# Patient Record
Sex: Female | Born: 1965 | Race: Black or African American | Hispanic: No | Smoking: Never smoker
Health system: Southern US, Community
[De-identification: ages and names within clinical notes are randomized; demographics above are authoritative.]

---

## 1995-07-14 HISTORY — PX: NASAL SINUS SURGERY: SHX719

## 2001-07-13 HISTORY — PX: ESOPHAGOGASTRODUODENOSCOPY: SHX1529

## 2009-12-11 LAB — HM MAMMOGRAPHY: HM MAMMO: NORMAL

## 2010-10-31 LAB — HM PAP SMEAR: HM Pap smear: NORMAL

## 2013-07-13 HISTORY — PX: DILATION AND CURETTAGE OF UTERUS: SHX78

## 2014-03-13 HISTORY — PX: OVARIAN CYST REMOVAL: SHX89

## 2015-02-20 ENCOUNTER — Encounter: Payer: Self-pay | Admitting: Internal Medicine

## 2015-02-20 DIAGNOSIS — K219 Gastro-esophageal reflux disease without esophagitis: Secondary | ICD-10-CM | POA: Insufficient documentation

## 2015-02-20 DIAGNOSIS — M1710 Unilateral primary osteoarthritis, unspecified knee: Secondary | ICD-10-CM | POA: Insufficient documentation

## 2015-02-20 DIAGNOSIS — B009 Herpesviral infection, unspecified: Secondary | ICD-10-CM | POA: Insufficient documentation

## 2015-02-20 DIAGNOSIS — B351 Tinea unguium: Secondary | ICD-10-CM | POA: Insufficient documentation

## 2015-02-20 DIAGNOSIS — M179 Osteoarthritis of knee, unspecified: Secondary | ICD-10-CM | POA: Insufficient documentation

## 2015-02-20 DIAGNOSIS — N200 Calculus of kidney: Secondary | ICD-10-CM | POA: Insufficient documentation

## 2015-02-20 DIAGNOSIS — N946 Dysmenorrhea, unspecified: Secondary | ICD-10-CM | POA: Insufficient documentation

## 2015-02-22 ENCOUNTER — Ambulatory Visit: Payer: Self-pay | Admitting: Family Medicine

## 2015-06-20 ENCOUNTER — Other Ambulatory Visit: Payer: Self-pay

## 2015-07-03 ENCOUNTER — Other Ambulatory Visit: Payer: Self-pay

## 2016-09-30 ENCOUNTER — Encounter: Payer: Self-pay | Admitting: Internal Medicine

## 2016-09-30 ENCOUNTER — Ambulatory Visit (INDEPENDENT_AMBULATORY_CARE_PROVIDER_SITE_OTHER): Payer: BLUE CROSS/BLUE SHIELD | Admitting: Internal Medicine

## 2016-09-30 VITALS — BP 124/70 | HR 72 | Ht 64.0 in | Wt 193.0 lb

## 2016-09-30 DIAGNOSIS — K635 Polyp of colon: Secondary | ICD-10-CM | POA: Diagnosis not present

## 2016-09-30 DIAGNOSIS — M62838 Other muscle spasm: Secondary | ICD-10-CM

## 2016-09-30 DIAGNOSIS — N946 Dysmenorrhea, unspecified: Secondary | ICD-10-CM

## 2016-09-30 DIAGNOSIS — B351 Tinea unguium: Secondary | ICD-10-CM | POA: Diagnosis not present

## 2016-09-30 MED ORDER — CYCLOBENZAPRINE HCL 10 MG PO TABS: 10.0000 mg | ORAL_TABLET | Freq: Three times a day (TID) | ORAL | 1 refills | Status: AC | PRN

## 2016-09-30 NOTE — Progress Notes (Signed)
Date:  09/30/2016   Name:  Margaret HousemanLisa Ducat   DOB:  08/26/1965   MRN:  [id number]  Previous patient last seen in October 2015.  She sees GYN for routine exams.  Is up to date on Pap, Mammogram and colonoscopy.  Chief Complaint: Establish Care and Back Pain (Radiates from neck down to mid-back. Pain is also in right shoulder. Tried Tylenol Arthritis and does nothing for it. Worse at night. Can't sleep at night due to back pain. )  Back Pain  This is a new problem. The current episode started more than 1 month ago. The problem occurs constantly. The problem has been gradually worsening since onset. The pain is present in the thoracic spine. The quality of the pain is described as aching. Radiates to: right arm. The pain is worse during the night. The symptoms are aggravated by lying down. Pertinent negatives include no abdominal pain, chest pain, fever, headaches, numbness, paresthesias, perianal numbness or weakness. She has tried NSAIDs for the symptoms.  No known injury.  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  She thinks that her work space is ergonomic.  She has the most pain trying to sleep - wants to sleep on her side but can't. She as taken advil but when she takes high doses she has heavy menstrual bleeding. She has not tried massage, heat or ice.   Review of Systems  Constitutional: Negative for chills, diaphoresis and fever.  Eyes: Negative for visual disturbance.  Respiratory: Negative for chest tightness, shortness of breath and wheezing.   Cardiovascular: Negative for chest pain, palpitations and leg swelling.  Gastrointestinal: Negative for abdominal pain.  Musculoskeletal: Positive for back pain and myalgias. Negative for gait problem.  Skin: Negative for color change and rash.  Neurological: Negative for weakness, numbness, headaches and paresthesias.  Hematological: Negative for adenopathy.  Psychiatric/Behavioral: Positive for sleep disturbance.    Patient Active  Problem List   Diagnosis Date Noted  . Acid reflux 02/20/2015  . Dysmenorrhea 02/20/2015  . Arthritis of knee, degenerative 02/20/2015  . Calcium kidney stones 02/20/2015  . Fungal infection of toenail 02/20/2015  . 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 Authorizing Provider  Black Cohosh 20 MG TABS Take 1 tablet by mouth daily.   Yes Historical Provider, MD  cholecalciferol (VITAMIN D) 1000 units tablet Take 1,000 Units by mouth daily.   Yes Historical Provider, MD  ferrous sulfate 325 (65 FE) MG EC tablet Take 325 mg by mouth 3 (three) times daily with meals.   Yes Historical Provider, MD  ibuprofen (IBU-200) 200 MG tablet Take 200 mg by mouth every 6 (six) hours as needed.   Yes Historical Provider, MD    Allergies  Allergen Reactions  . Diphenhydramine Hcl Swelling  . Requip  [Ropinirole]     Other reaction(s): Headache  . Sulfa Antibiotics Rash    Past Surgical History:  Procedure Laterality Date  . DILATION AND CURETTAGE OF UTERUS  2015   uterine polyp  . ESOPHAGOGASTRODUODENOSCOPY  2003   HH and GERD  . NASAL SINUS SURGERY  1997  . OVARIAN CYST REMOVAL  03/2014    Social History  Substance Use Topics  . Smoking status: Never Smoker  . Smokeless tobacco: Never Used  . Alcohol use No     Medication list has been reviewed and updated.   Physical Exam  Constitutional: She is oriented to  person, place, and time. She appears well-developed. No distress.  HENT:  Head: Normocephalic and atraumatic.  Cardiovascular: Normal rate, regular rhythm and normal heart sounds.   Pulmonary/Chest: Effort normal and breath sounds normal. No respiratory distress. She has no wheezes.  Musculoskeletal:       Cervical back: She exhibits decreased range of motion, tenderness and spasm.       Thoracic back: She exhibits spasm. She exhibits no bony tenderness.  Neurological: She is alert and oriented to  person, place, and time. She has normal strength and normal reflexes. No sensory deficit.  Skin: Skin is warm and dry. No rash noted.  Fungal nail thickening great, second and fourth toes on right foot.  Great toenail is loosening.  Psychiatric: She has a normal mood and affect. Her behavior is normal. Thought content normal.  Nursing note and vitals reviewed.   BP 124/70 (BP Location: Right Arm, Patient Position: Sitting, Cuff Size: Normal)   Pulse 72   Ht 5\' 4"  (1.626 m)   Wt 193 lb (87.5 kg)   LMP 07/01/2016 (Within Weeks)   SpO2 98%   BMI 33.13 kg/m   Assessment and Plan: 1. Neck muscle spasm Tylenol 1000 mg tid Heat 20 minutes three times a day Flexeril as instructed Positioning and neck support advise provided  2. Fungal infection of toenail Do not recommend aggressive treatment since only minimally sx  3. Dysmenorrhea Follow up with GYN if needed Perimenopausal by history   Meds ordered this encounter  Medications  . cyclobenzaprine (FLEXERIL) 10 MG tablet    Sig: Take 1 tablet (10 mg total) by mouth 3 (three) times daily as needed for muscle spasms.    Dispense:  30 tablet    Refill:  1    Bari Edward, MD [facility]  09/30/2016

## 2016-09-30 NOTE — Patient Instructions (Signed)
Cyclobenzaprine - take 1/2 tablet when you get home from work and apply heat for 20 minutes.  Take 1 tablet at bedtime.  Tylenol 1000 mg three times a day

## 2016-10-19 ENCOUNTER — Ambulatory Visit (INDEPENDENT_AMBULATORY_CARE_PROVIDER_SITE_OTHER): Payer: BLUE CROSS/BLUE SHIELD | Admitting: Internal Medicine

## 2016-10-19 ENCOUNTER — Encounter: Payer: Self-pay | Admitting: Internal Medicine

## 2016-10-19 VITALS — BP 124/88 | HR 86 | Temp 98.0°F | Ht 64.0 in | Wt 192.0 lb

## 2016-10-19 DIAGNOSIS — Z87442 Personal history of urinary calculi: Secondary | ICD-10-CM | POA: Diagnosis not present

## 2016-10-19 DIAGNOSIS — R102 Pelvic and perineal pain: Secondary | ICD-10-CM | POA: Diagnosis not present

## 2016-10-19 LAB — POC URINALYSIS WITH MICROSCOPIC (NON AUTO)MANUAL RESULT
Bilirubin, UA: NEGATIVE
Crystals: 0
Epithelial cells, urine per micros: 0
Glucose, UA: NEGATIVE
Ketones, UA: NEGATIVE
Mucus, UA: 0
NITRITE UA: NEGATIVE
PH UA: 5 (ref 5.0–8.0)
Protein, UA: NEGATIVE
RBC: 3 M/uL — AB (ref 4.04–5.48)
SPEC GRAV UA: 1.005 (ref 1.030–1.035)
Urobilinogen, UA: 0.2 (ref ?–2.0)
WBC CASTS UA: 10

## 2016-10-19 MED ORDER — CIPROFLOXACIN HCL 250 MG PO TABS: 250.0000 mg | ORAL_TABLET | Freq: Two times a day (BID) | ORAL | 0 refills | Status: DC

## 2016-10-19 NOTE — Progress Notes (Signed)
Date:  10/19/2016   Name:  Margaret Medina   DOB:  11-Sep-1965   MRN:  [id number]   Chief Complaint: Abdominal Pain (lower abdominal pain- spasming and sharp. Has previously had symptoms before and this feels the same. 2 days ago passed out in bathroom twice due to pain. Was having sweating, fever, cold chills, and headaches. Felt like had to throw up but did not. There is also pressure on rectum. "Feels like have to use bathroom but do not have to go.") Abdominal Pain  This is a new problem. The current episode started in the past 7 days. The onset quality is sudden. The problem occurs intermittently. The problem has been rapidly improving. The pain is located in the suprapubic region and left flank. The pain is mild. The quality of the pain is colicky and cramping. The abdominal pain radiates to the suprapubic region. Associated symptoms include dysuria. Pertinent negatives include no arthralgias, fever, frequency or hematuria. The pain is relieved by nothing.  Today she is much better - still has some lower abd/suprapubic discomfort and mild dysuria.  No obvious blood in the urine.  No further rectal pressure, diaphoresis or lightheadedness. She had a normal stool 36 hours ago.  No vomiting or nausea.   Review of Systems  Constitutional: Negative for chills, diaphoresis and fever.  Respiratory: Negative for chest tightness and shortness of breath.   Cardiovascular: Negative for chest pain and palpitations.  Gastrointestinal: Positive for abdominal pain. Negative for blood in stool.  Genitourinary: Positive for dysuria. Negative for frequency and hematuria.  Musculoskeletal: Negative for arthralgias and back pain.    Patient Active Problem List   Diagnosis Date Noted  . Colon polyp, hyperplastic 09/30/2016  . Acid reflux 02/20/2015  . Dysmenorrhea 02/20/2015  . Arthritis of knee, degenerative 02/20/2015  . Calcium kidney stones 02/20/2015  . Fungal infection of toenail 02/20/2015  .  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 Authorizing Provider  Black Cohosh 20 MG TABS Take 1 tablet by mouth daily.   Yes Historical Provider, MD  cholecalciferol (VITAMIN D) 1000 units tablet Take 1,000 Units by mouth daily.   Yes Historical Provider, MD  cyclobenzaprine (FLEXERIL) 10 MG tablet Take 1 tablet (10 mg total) by mouth 3 (three) times daily as needed for muscle spasms. 09/30/16  Yes Reubin Milan, MD  ferrous sulfate 325 (65 FE) MG EC tablet Take 325 mg by mouth 3 (three) times daily with meals.   Yes Historical Provider, MD  ibuprofen (IBU-200) 200 MG tablet Take 200 mg by mouth every 6 (six) hours as needed.   Yes Historical Provider, MD    Allergies  Allergen Reactions  . Diphenhydramine Hcl Swelling  . Requip  [Ropinirole]     Other reaction(s): Headache  . Sulfa Antibiotics Rash    Past Surgical History:  Procedure Laterality Date  . DILATION AND CURETTAGE OF UTERUS  2015   uterine polyp  . ESOPHAGOGASTRODUODENOSCOPY  2003   HH and GERD  . NASAL SINUS SURGERY  1997  . OVARIAN CYST REMOVAL  03/2014    Social History  Substance Use Topics  . Smoking status: Never Smoker  . Smokeless tobacco: Never Used  . Alcohol use No     Medication list has been reviewed and updated.   Physical Exam  Constitutional: She is oriented to person, place, and time. She appears well-developed. No distress.  HENT:  Head:  Normocephalic and atraumatic.  Neck: Normal range of motion.  Cardiovascular: Normal rate, regular rhythm and normal heart sounds.   Pulmonary/Chest: Effort normal and breath sounds normal. No respiratory distress.  Abdominal: Soft. Normal appearance and bowel sounds are normal. There is no hepatosplenomegaly. There is tenderness in the suprapubic area. There is CVA tenderness (mild left sided). There is no rigidity and no guarding.  Musculoskeletal: Normal range of motion.    Neurological: She is alert and oriented to person, place, and time.  Skin: Skin is warm and dry. No rash noted.  Psychiatric: She has a normal mood and affect. Her speech is normal and behavior is normal. Thought content normal.  Nursing note and vitals reviewed.   BP 124/88 (BP Location: Right Arm, Patient Position: Sitting, Cuff Size: Normal)   Pulse 86   Temp 98 F (36.7 C)   Ht  (1.626 m)   Wt 192 lb (87.1 kg)   SpO2 98%   BMI 32.96 kg/m   Assessment and Plan: 1. Acute suprapubic pain Mild UTI by UA - POC urinalysis w microscopic (non auto)  2. History of renal stone Likely passed a stone yesterday Will monitor for recurrence - rec ER if severe sx   Meds ordered this encounter  Medications  . ciprofloxacin (CIPRO) 250 MG tablet    Sig: Take 1 tablet (250 mg total) by mouth 2 (two) times daily.    Dispense:  14 tablet    Refill:  0    Bari Edward, MD [facility]  10/19/2016

## 2016-11-20 ENCOUNTER — Encounter: Payer: Self-pay | Admitting: Internal Medicine

## 2016-11-20 ENCOUNTER — Ambulatory Visit (INDEPENDENT_AMBULATORY_CARE_PROVIDER_SITE_OTHER): Payer: BLUE CROSS/BLUE SHIELD | Admitting: Internal Medicine

## 2016-11-20 ENCOUNTER — Ambulatory Visit
Admission: RE | Admit: 2016-11-20 | Discharge: 2016-11-20 | Disposition: A | Discharge status: Home / Self Care | Payer: BLUE CROSS/BLUE SHIELD | Source: Ambulatory Visit | Attending: Internal Medicine | Admitting: Internal Medicine

## 2016-11-20 ENCOUNTER — Other Ambulatory Visit: Payer: Self-pay | Admitting: Internal Medicine

## 2016-11-20 VITALS — BP 128/76 | HR 74 | Ht 64.0 in | Wt 195.0 lb

## 2016-11-20 DIAGNOSIS — R109 Unspecified abdominal pain: Secondary | ICD-10-CM | POA: Diagnosis not present

## 2016-11-20 DIAGNOSIS — M545 Low back pain: Secondary | ICD-10-CM | POA: Insufficient documentation

## 2016-11-20 LAB — POC URINALYSIS WITH MICROSCOPIC (NON AUTO)MANUAL RESULT
Bilirubin, UA: NEGATIVE
Crystals: 0
Epithelial cells, urine per micros: 0
Glucose, UA: NEGATIVE
KETONES UA: NEGATIVE
MUCUS UA: 0
NITRITE UA: NEGATIVE
PH UA: 6.5 (ref 5.0–8.0)
Protein, UA: NEGATIVE
RBC: 2 M/uL — AB (ref 4.04–5.48)
Spec Grav, UA: 1.02 (ref 1.010–1.025)
Urobilinogen, UA: 0.2 E.U./dL
WBC CASTS UA: 2

## 2016-11-20 MED ORDER — NITROFURANTOIN MONOHYD MACRO 100 MG PO CAPS: 100.0000 mg | ORAL_CAPSULE | Freq: Two times a day (BID) | ORAL | 0 refills | Status: AC

## 2016-11-20 NOTE — Patient Instructions (Signed)
Continue tylenol and fluids  Take muscle relaxant at bedtime.

## 2016-11-20 NOTE — Progress Notes (Signed)
Date:  11/20/2016   Name:  Margaret Medina   DOB:  [birth year]   MRN:  [id number]   Chief Complaint: Urinary Tract Infection (X 2 weeks. Pain hurts in middle of back and on Lower Left side. Frequent urination- ) Urinary Tract Infection   This is a recurrent problem. The current episode started in the past 7 days. The pain is mild. There has been no fever. Associated symptoms include chills, flank pain, frequency and nausea. Pertinent negatives include no hematuria. She has tried acetaminophen for the symptoms. The treatment provided mild relief.  Back Pain  This is a recurrent problem. The pain is present in the lumbar spine. The pain does not radiate. The pain is moderate. Associated symptoms include headaches. Pertinent negatives include no abdominal pain, chest pain or dysuria. She has tried analgesics for the symptoms. The treatment provided moderate relief.      Review of Systems  Constitutional: Positive for chills.  Eyes: Negative for visual disturbance.  Respiratory: Negative for shortness of breath.   Cardiovascular: Negative for chest pain, palpitations and leg swelling.  Gastrointestinal: Positive for nausea. Negative for abdominal pain.  Genitourinary: Positive for flank pain and frequency. Negative for dysuria and hematuria.  Musculoskeletal: Positive for back pain.  Neurological: Positive for dizziness and headaches. Negative for tremors and syncope.    Patient Active Problem List   Diagnosis Date Noted  . Colon polyp, hyperplastic 09/30/2016  . Acid reflux 02/20/2015  . Dysmenorrhea 02/20/2015  . Arthritis of knee, degenerative 02/20/2015  . Calcium kidney stones 02/20/2015  . Fungal infection of toenail 02/20/2015  . 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 Authorizing Provider  Black Cohosh 20 MG TABS Take 1 tablet by mouth daily.   Yes [provider]  cholecalciferol (VITAMIN D)  1000 units tablet Take 1,000 Units by mouth daily.   Yes [provider]  cyclobenzaprine (FLEXERIL) 10 MG tablet Take 1 tablet (10 mg total) by mouth 3 (three) times daily as needed for muscle spasms. 09/30/16  Yes Reubin Milan, MD  ferrous sulfate 325 (65 FE) MG EC tablet Take 325 mg by mouth 3 (three) times daily with meals.   Yes [provider]  ibuprofen (IBU-200) 200 MG tablet Take 200 mg by mouth every 6 (six) hours as needed.   Yes [provider]    Allergies  Allergen Reactions  . Diphenhydramine Hcl Swelling  . Requip  [Ropinirole]     Other reaction(s): Headache  . Sulfa Antibiotics Rash    Past Surgical History:  Procedure Laterality Date  . DILATION AND CURETTAGE OF UTERUS  2015   uterine polyp  . ESOPHAGOGASTRODUODENOSCOPY  2003   HH and GERD  . NASAL SINUS SURGERY  1997  . OVARIAN CYST REMOVAL  03/2014    Social History  Substance Use Topics  . Smoking status: Never Smoker  . Smokeless tobacco: Never Used  . Alcohol use No     Medication list has been reviewed and updated.   Physical Exam  Constitutional: She is oriented to person, place, and time. She appears well-developed. No distress.  HENT:  Head: Normocephalic and atraumatic.  Cardiovascular: Normal rate, regular rhythm and normal heart sounds.   Pulmonary/Chest: Effort normal and breath sounds normal. No respiratory distress.  Abdominal: Soft. Bowel sounds are normal. There is no tenderness. There is no CVA tenderness.  Musculoskeletal: Normal range of motion.  Lumbar back: She exhibits tenderness. She exhibits no spasm.  Neurological: She is alert and oriented to person, place, and time.  Skin: Skin is warm and dry. No rash noted.  Psychiatric: She has a normal mood and affect. Her behavior is normal. Thought content normal.  Nursing note and vitals reviewed.   BP 128/76   Pulse 74   Ht 5\' 4"  (1.626 m)   Wt 195 lb (88.5 kg)   SpO2 98%   BMI 33.47 kg/m    Assessment and Plan: 1. Flank pain Renal stone/uti vs lumbar strain Continue tylenol and resume cyclobenzaprine at HS - POC urinalysis w microscopic (non auto) - Urine culture - DG Lumbar Spine Complete; Future - nitrofurantoin, macrocrystal-monohydrate, (MACROBID) 100 MG capsule; Take 1 capsule (100 mg total) by mouth 2 (two) times daily.  Dispense: 14 capsule; Refill: 0   Meds ordered this encounter  Medications  . nitrofurantoin, macrocrystal-monohydrate, (MACROBID) 100 MG capsule    Sig: Take 1 capsule (100 mg total) by mouth 2 (two) times daily.    Dispense:  14 capsule    Refill:  0    Bari EdwardLaura Jaleiyah Alas, MD [facility]  11/20/2016

## 2016-11-24 LAB — URINE CULTURE

## 2018-12-23 IMAGING — CR DG LUMBAR SPINE COMPLETE 4+V
5 series · 5 of 5 positions shown · non-contrast
Comparison: None.

CLINICAL DATA: Mid lower back pain for the past month.

EXAM:
LUMBAR SPINE - COMPLETE 4+ VIEW

[l-spine ap]
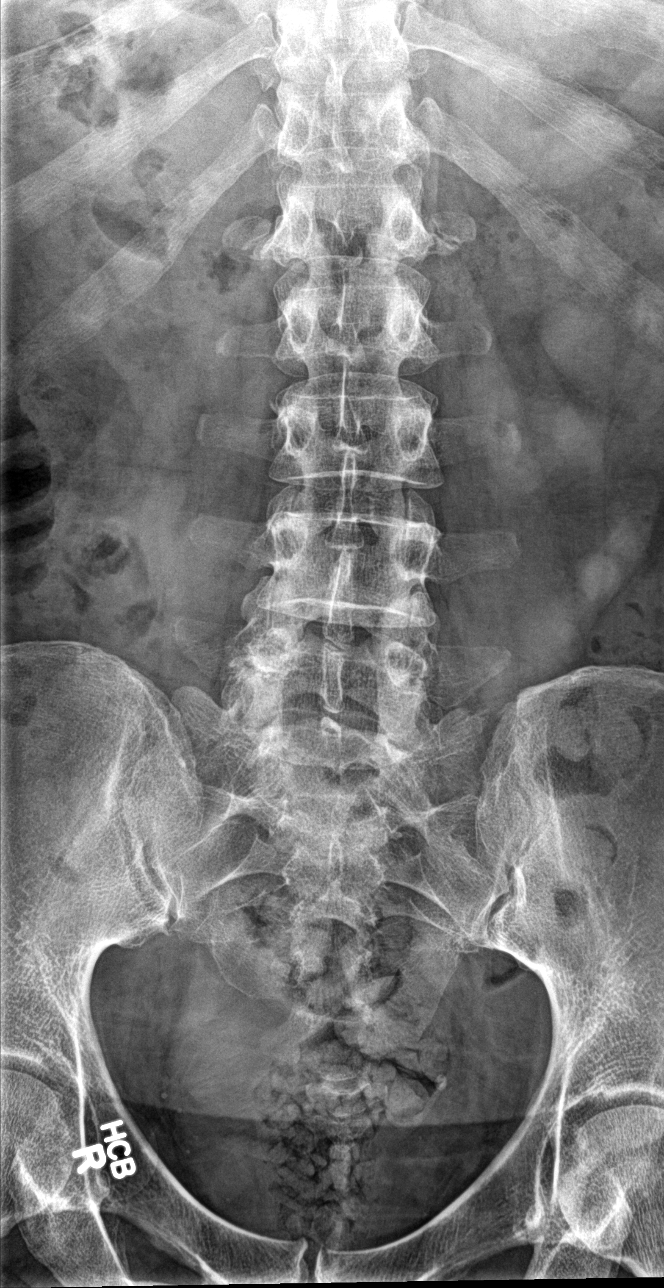

[l-spine obl (1 of 2)]
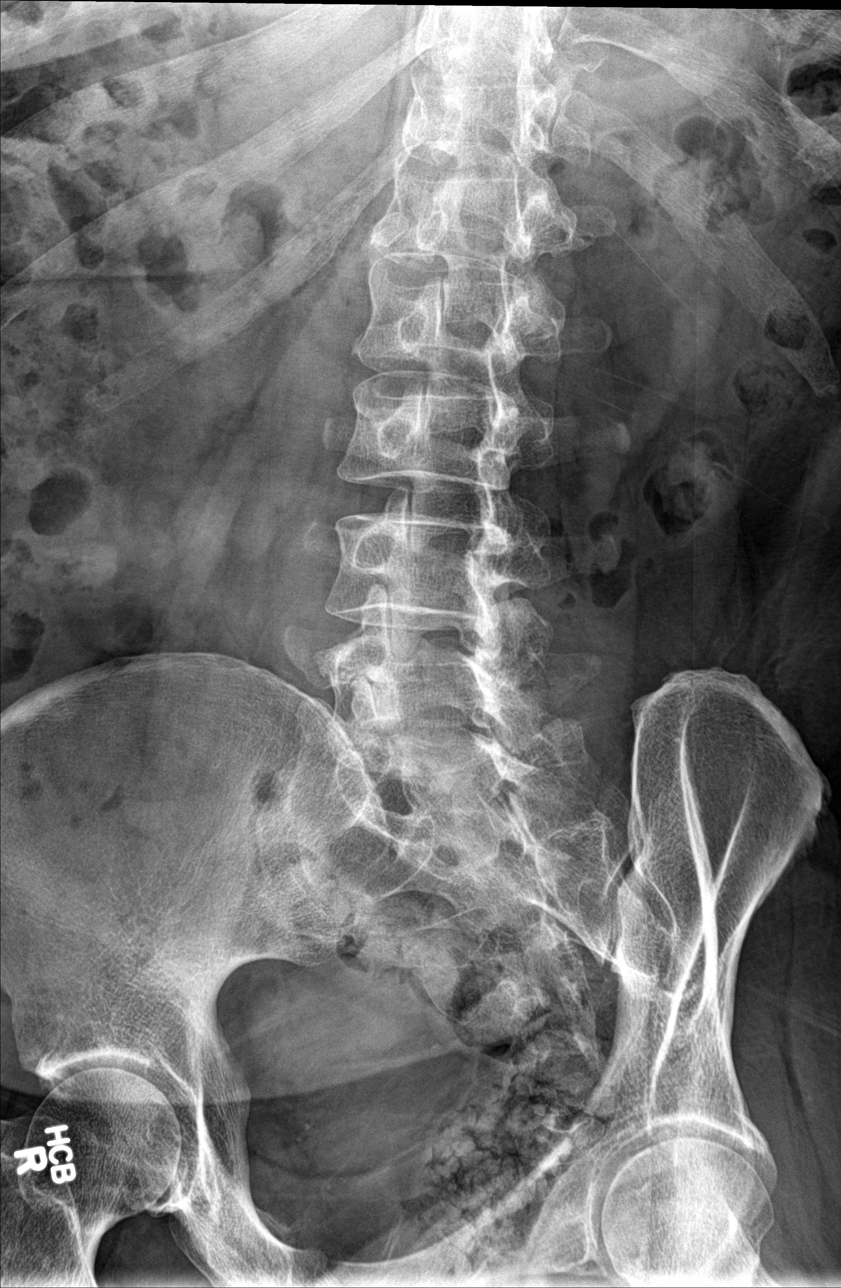

[l-spine obl (2 of 2)]
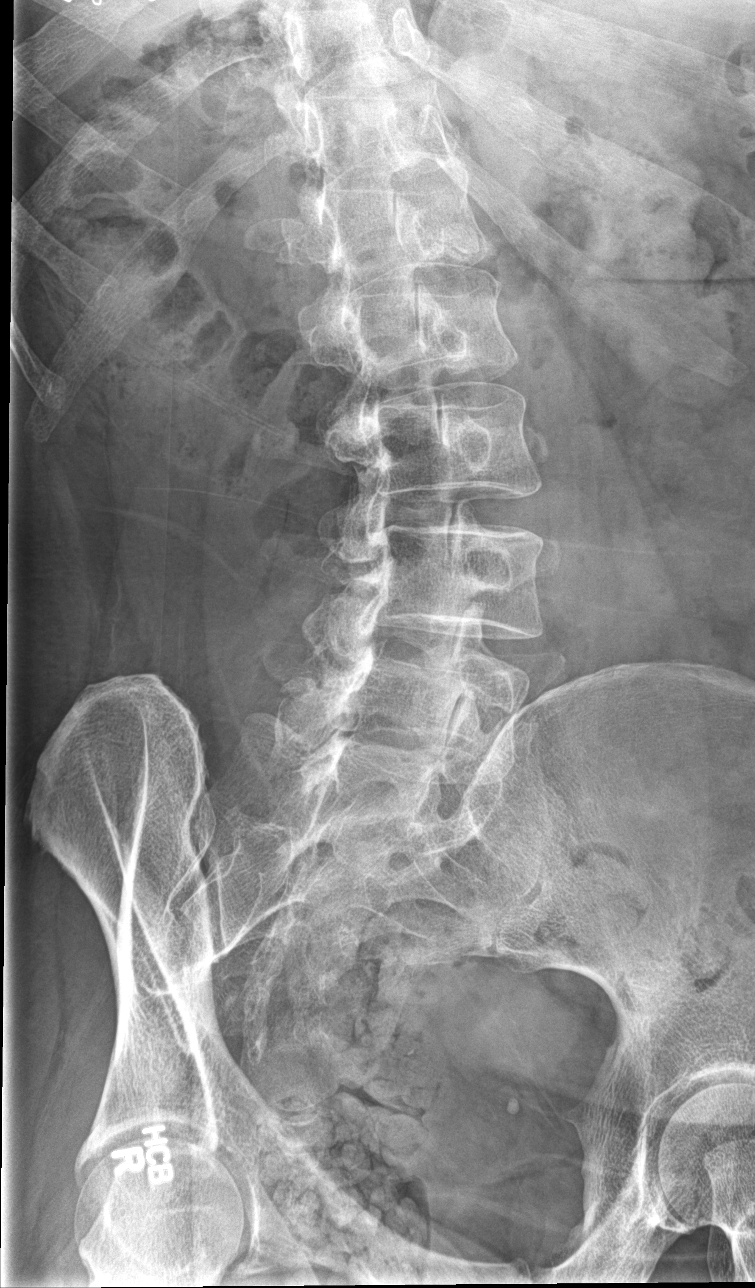

[l-spine lat]
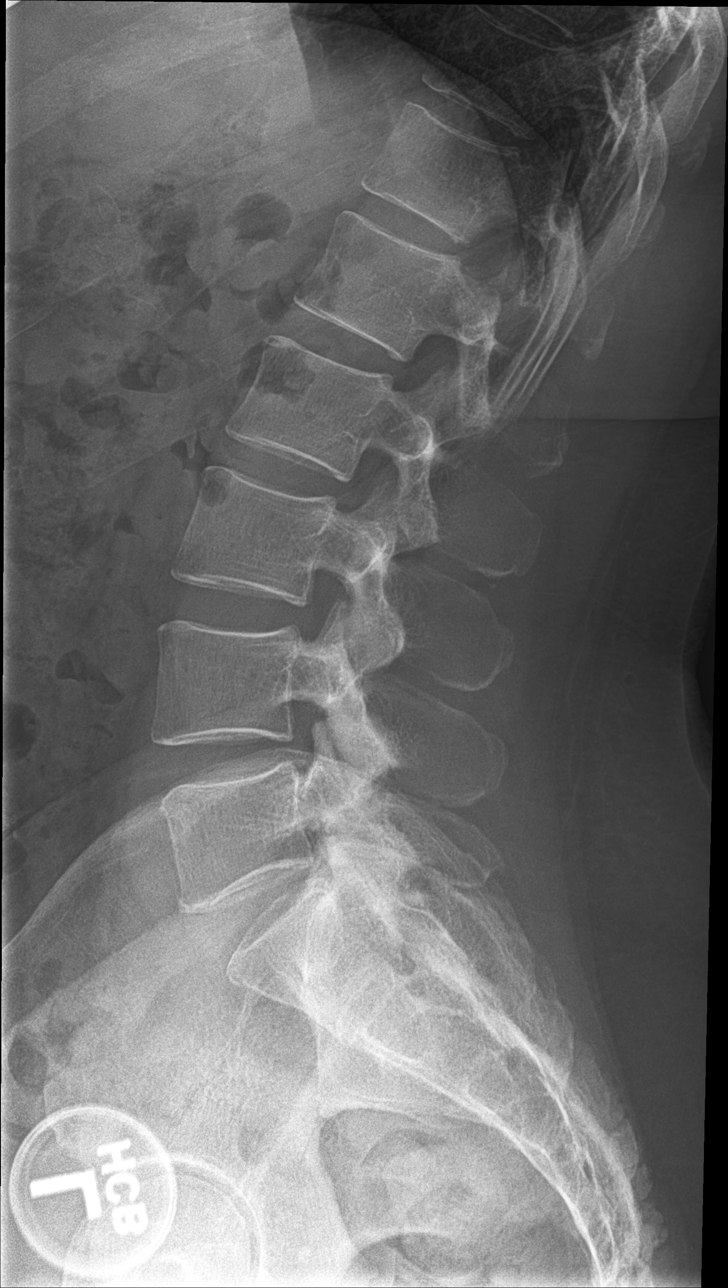

[l-spine spot]
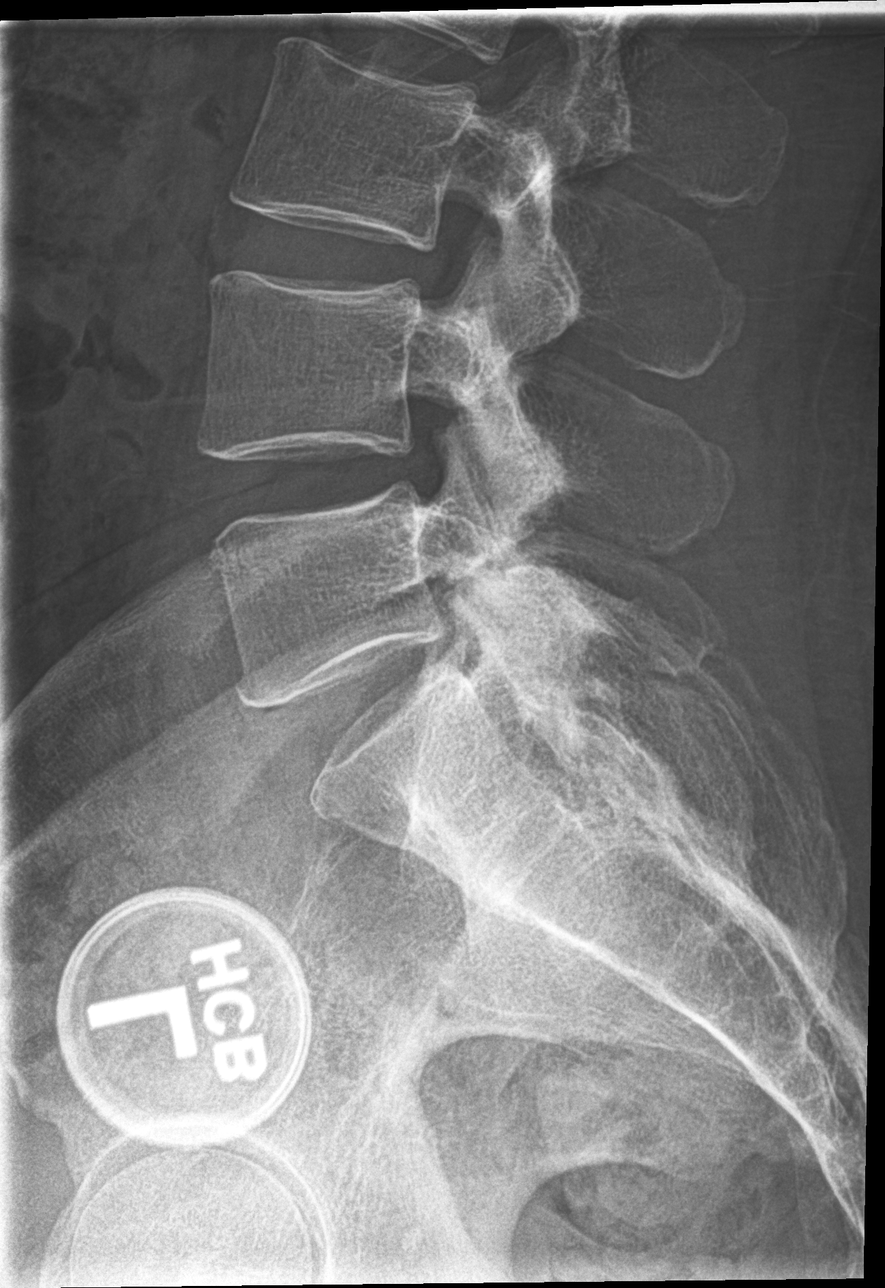

[5 of 5 positions shown; findings below may reference images not displayed]

FINDINGS: Transitional thoracolumbar and lumbosacral vertebrae with 4
intervening lumbar vertebrae. Minimal levoconvex lumbar scoliosis.
Mild facet degenerative changes in the lower lumbar spine and
lumbosacral junction. Minimal anterior spur formation at the
lumbosacral junction. No fractures, pars defects or subluxations.
IMPRESSION: Mild lower lumbar spine and lumbosacral junction facet degenerative
changes. No acute abnormality.
